# Patient Record
Sex: Male | Born: 1995 | Race: Black or African American | Hispanic: No | Marital: Single | Smoking: Former smoker
Health system: Southern US, Community
[De-identification: ages and names within clinical notes are randomized; demographics above are authoritative.]

## PROBLEM LIST (undated history)

## (undated) DIAGNOSIS — Z8489 Family history of other specified conditions: Secondary | ICD-10-CM

---
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
## 2021-02-16 ENCOUNTER — Inpatient Hospital Stay (HOSPITAL_COMMUNITY): Payer: Commercial Managed Care - PPO | Admitting: Anesthesiology

## 2021-02-16 ENCOUNTER — Inpatient Hospital Stay (HOSPITAL_COMMUNITY): Payer: Commercial Managed Care - PPO

## 2021-02-16 ENCOUNTER — Other Ambulatory Visit: Payer: Self-pay

## 2021-02-16 ENCOUNTER — Encounter (HOSPITAL_COMMUNITY): Admission: EM | Disposition: A | Discharge status: Home / Self Care | Payer: Self-pay | Source: Home / Self Care

## 2021-02-16 ENCOUNTER — Emergency Department (HOSPITAL_COMMUNITY): Payer: Commercial Managed Care - PPO

## 2021-02-16 ENCOUNTER — Encounter (HOSPITAL_COMMUNITY): Payer: Self-pay

## 2021-02-16 ENCOUNTER — Inpatient Hospital Stay (HOSPITAL_COMMUNITY)
Admission: EM | Admit: 2021-02-16 | Discharge: 2021-02-19 | DRG: 956 | Disposition: A | Discharge status: Home / Self Care | Payer: Commercial Managed Care - PPO | Attending: General Surgery | Admitting: General Surgery

## 2021-02-16 DIAGNOSIS — S0083XA Contusion of other part of head, initial encounter: Secondary | ICD-10-CM | POA: Diagnosis present

## 2021-02-16 DIAGNOSIS — S36113A Laceration of liver, unspecified degree, initial encounter: Secondary | ICD-10-CM | POA: Diagnosis not present

## 2021-02-16 DIAGNOSIS — S36115A Moderate laceration of liver, initial encounter: Secondary | ICD-10-CM | POA: Diagnosis present

## 2021-02-16 DIAGNOSIS — Y92410 Unspecified street and highway as the place of occurrence of the external cause: Secondary | ICD-10-CM | POA: Diagnosis not present

## 2021-02-16 DIAGNOSIS — Z20822 Contact with and (suspected) exposure to covid-19: Secondary | ICD-10-CM | POA: Diagnosis present

## 2021-02-16 DIAGNOSIS — Z419 Encounter for procedure for purposes other than remedying health state, unspecified: Secondary | ICD-10-CM

## 2021-02-16 DIAGNOSIS — S72001A Fracture of unspecified part of neck of right femur, initial encounter for closed fracture: Secondary | ICD-10-CM | POA: Diagnosis not present

## 2021-02-16 DIAGNOSIS — D62 Acute posthemorrhagic anemia: Secondary | ICD-10-CM | POA: Diagnosis present

## 2021-02-16 DIAGNOSIS — S72331A Displaced oblique fracture of shaft of right femur, initial encounter for closed fracture: Secondary | ICD-10-CM

## 2021-02-16 DIAGNOSIS — Z23 Encounter for immunization: Secondary | ICD-10-CM | POA: Diagnosis not present

## 2021-02-16 DIAGNOSIS — E860 Dehydration: Secondary | ICD-10-CM | POA: Diagnosis present

## 2021-02-16 DIAGNOSIS — S2243XA Multiple fractures of ribs, bilateral, initial encounter for closed fracture: Secondary | ICD-10-CM | POA: Diagnosis present

## 2021-02-16 DIAGNOSIS — M549 Dorsalgia, unspecified: Secondary | ICD-10-CM | POA: Diagnosis present

## 2021-02-16 DIAGNOSIS — R000 Tachycardia, unspecified: Secondary | ICD-10-CM | POA: Diagnosis present

## 2021-02-16 DIAGNOSIS — N179 Acute kidney failure, unspecified: Secondary | ICD-10-CM | POA: Diagnosis present

## 2021-02-16 DIAGNOSIS — Z9889 Other specified postprocedural states: Secondary | ICD-10-CM

## 2021-02-16 DIAGNOSIS — S7291XA Unspecified fracture of right femur, initial encounter for closed fracture: Secondary | ICD-10-CM | POA: Diagnosis not present

## 2021-02-16 DIAGNOSIS — Z87891 Personal history of nicotine dependence: Secondary | ICD-10-CM | POA: Diagnosis not present

## 2021-02-16 DIAGNOSIS — T1490XA Injury, unspecified, initial encounter: Secondary | ICD-10-CM

## 2021-02-16 DIAGNOSIS — S27321A Contusion of lung, unilateral, initial encounter: Secondary | ICD-10-CM | POA: Diagnosis present

## 2021-02-16 DIAGNOSIS — S2243XS Multiple fractures of ribs, bilateral, sequela: Secondary | ICD-10-CM

## 2021-02-16 HISTORY — DX: Family history of other specified conditions: Z84.89

## 2021-02-16 HISTORY — PX: FEMUR IM NAIL: SHX1597

## 2021-02-16 LAB — COMPREHENSIVE METABOLIC PANEL
ALT: 592 U/L — ABNORMAL HIGH (ref 0–44)
AST: 579 U/L — ABNORMAL HIGH (ref 15–41)
Albumin: 4.4 g/dL (ref 3.5–5.0)
Alkaline Phosphatase: 69 U/L (ref 38–126)
Anion gap: 14 (ref 5–15)
BUN: 16 mg/dL (ref 6–20)
CO2: 25 mmol/L (ref 22–32)
Calcium: 9.6 mg/dL (ref 8.9–10.3)
Chloride: 103 mmol/L (ref 98–111)
Creatinine, Ser: 1.44 mg/dL — ABNORMAL HIGH (ref 0.61–1.24)
GFR, Estimated: >60 mL/min (ref >60)
Glucose, Bld: 122 mg/dL — ABNORMAL HIGH (ref 70–99)
Potassium: 3 mmol/L — ABNORMAL LOW (ref 3.5–5.1)
Sodium: 142 mmol/L (ref 135–145)
Total Bilirubin: 0.5 mg/dL (ref 0.3–1.2)
Total Protein: 7.4 g/dL (ref 6.5–8.1)

## 2021-02-16 LAB — TYPE AND SCREEN
ABO/RH(D): O POS
Antibody Screen: NEGATIVE

## 2021-02-16 LAB — ETHANOL: Alcohol, Ethyl (B): <10 mg/dL (ref <10)

## 2021-02-16 LAB — CBC
HCT: 46.3 % (ref 39.0–52.0)
Hemoglobin: 15.4 g/dL (ref 13.0–17.0)
MCH: 29.6 pg (ref 26.0–34.0)
MCHC: 33.3 g/dL (ref 30.0–36.0)
MCV: 89 fL (ref 80.0–100.0)
Platelets: 248 10*3/uL (ref 150–400)
RBC: 5.2 MIL/uL (ref 4.22–5.81)
RDW: 11.8 % (ref 11.5–15.5)
WBC: 8.2 10*3/uL (ref 4.0–10.5)
nRBC: 0 % (ref 0.0–0.2)

## 2021-02-16 LAB — I-STAT CHEM 8, ED
BUN: 18 mg/dL (ref 6–20)
Calcium, Ion: 1.2 mmol/L (ref 1.15–1.40)
Chloride: 102 mmol/L (ref 98–111)
Creatinine, Ser: 1.4 mg/dL — ABNORMAL HIGH (ref 0.61–1.24)
Glucose, Bld: 120 mg/dL — ABNORMAL HIGH (ref 70–99)
HCT: 47 % (ref 39.0–52.0)
Hemoglobin: 16 g/dL (ref 13.0–17.0)
Potassium: 2.9 mmol/L — ABNORMAL LOW (ref 3.5–5.1)
Sodium: 143 mmol/L (ref 135–145)
TCO2: 26 mmol/L (ref 22–32)

## 2021-02-16 LAB — URINALYSIS, ROUTINE W REFLEX MICROSCOPIC
Bilirubin Urine: NEGATIVE
Glucose, UA: NEGATIVE mg/dL
Ketones, ur: NEGATIVE mg/dL
Leukocytes,Ua: NEGATIVE
Nitrite: NEGATIVE
Protein, ur: 100 mg/dL — AB
Specific Gravity, Urine: 1.016 (ref 1.005–1.030)
pH: 6 (ref 5.0–8.0)

## 2021-02-16 LAB — RESP PANEL BY RT-PCR (FLU A&B, COVID) ARPGX2
Influenza A by PCR: NEGATIVE
Influenza B by PCR: NEGATIVE
SARS Coronavirus 2 by RT PCR: NEGATIVE

## 2021-02-16 LAB — PROTIME-INR
INR: 0.9 (ref 0.8–1.2)
Prothrombin Time: 12.4 seconds (ref 11.4–15.2)

## 2021-02-16 LAB — SAMPLE TO BLOOD BANK

## 2021-02-16 LAB — ABO/RH: ABO/RH(D): O POS

## 2021-02-16 LAB — LACTIC ACID, PLASMA: Lactic Acid, Venous: 3.3 mmol/L (ref 0.5–1.9)

## 2021-02-16 LAB — HIV ANTIBODY (ROUTINE TESTING W REFLEX): HIV Screen 4th Generation wRfx: NONREACTIVE

## 2021-02-16 SURGERY — INSERTION, INTRAMEDULLARY ROD, FEMUR
Anesthesia: General | Site: Hip | Laterality: Right

## 2021-02-16 MED ORDER — 0.9 % SODIUM CHLORIDE (POUR BTL) OPTIME
TOPICAL | Status: DC | PRN
  Administered 2021-02-16 (×2): 1000 mL

## 2021-02-16 MED ORDER — PROPOFOL 10 MG/ML IV BOLUS
INTRAVENOUS | Status: AC
  Filled 2021-02-16: qty 20

## 2021-02-16 MED ORDER — MORPHINE SULFATE (PF) 4 MG/ML IV SOLN
INTRAVENOUS | Status: AC
  Filled 2021-02-16: qty 1

## 2021-02-16 MED ORDER — MIDAZOLAM HCL 2 MG/2ML IJ SOLN
INTRAMUSCULAR | Status: AC
  Filled 2021-02-16: qty 2

## 2021-02-16 MED ORDER — CHLORHEXIDINE GLUCONATE 0.12 % MT SOLN
OROMUCOSAL | Status: AC
  Administered 2021-02-16: 15 mL via OROMUCOSAL
  Filled 2021-02-16: qty 15

## 2021-02-16 MED ORDER — OXYCODONE HCL 5 MG PO TABS
5.0000 mg | ORAL_TABLET | ORAL | Status: DC | PRN
  Administered 2021-02-17 – 2021-02-18 (×2): 5 mg via ORAL
  Filled 2021-02-16 (×2): qty 1

## 2021-02-16 MED ORDER — SUGAMMADEX SODIUM 200 MG/2ML IV SOLN
INTRAVENOUS | Status: DC | PRN
Start: 2021-02-16 — End: 2021-02-16
  Administered 2021-02-16: 200 mg via INTRAVENOUS

## 2021-02-16 MED ORDER — GLYCOPYRROLATE 0.2 MG/ML IJ SOLN
INTRAMUSCULAR | Status: DC | PRN
Start: 2021-02-16 — End: 2021-02-16
  Administered 2021-02-16: .2 mg via INTRAVENOUS

## 2021-02-16 MED ORDER — LIDOCAINE HCL (CARDIAC) PF 100 MG/5ML IV SOSY
PREFILLED_SYRINGE | INTRAVENOUS | Status: DC | PRN
  Administered 2021-02-16: 100 mg via INTRAVENOUS

## 2021-02-16 MED ORDER — CEFAZOLIN SODIUM-DEXTROSE 2-3 GM-%(50ML) IV SOLR
INTRAVENOUS | Status: DC | PRN
Start: 2021-02-16 — End: 2021-02-16
  Administered 2021-02-16: 2 g via INTRAVENOUS

## 2021-02-16 MED ORDER — FENTANYL CITRATE (PF) 250 MCG/5ML IJ SOLN
INTRAMUSCULAR | Status: AC
  Filled 2021-02-16: qty 5

## 2021-02-16 MED ORDER — PANTOPRAZOLE SODIUM 40 MG IV SOLR: 40.0000 mg | Freq: Every day | INTRAVENOUS | Status: DC

## 2021-02-16 MED ORDER — MORPHINE SULFATE (PF) 2 MG/ML IV SOLN
INTRAVENOUS | Status: AC | PRN
  Administered 2021-02-16: 4 mg via INTRAVENOUS

## 2021-02-16 MED ORDER — PHENYLEPHRINE 40 MCG/ML (10ML) SYRINGE FOR IV PUSH (FOR BLOOD PRESSURE SUPPORT)
PREFILLED_SYRINGE | INTRAVENOUS | Status: AC
  Filled 2021-02-16: qty 10

## 2021-02-16 MED ORDER — GLYCOPYRROLATE PF 0.2 MG/ML IJ SOSY
PREFILLED_SYRINGE | INTRAMUSCULAR | Status: AC
  Filled 2021-02-16: qty 1

## 2021-02-16 MED ORDER — ROCURONIUM 10MG/ML (10ML) SYRINGE FOR MEDFUSION PUMP - OPTIME
INTRAVENOUS | Status: DC | PRN
Start: 2021-02-16 — End: 2021-02-16
  Administered 2021-02-16: 100 mg via INTRAVENOUS

## 2021-02-16 MED ORDER — MIDAZOLAM HCL 2 MG/2ML IJ SOLN
INTRAMUSCULAR | Status: DC | PRN
Start: 2021-02-16 — End: 2021-02-16
  Administered 2021-02-16: 2 mg via INTRAVENOUS

## 2021-02-16 MED ORDER — PHENYLEPHRINE HCL-NACL 20-0.9 MG/250ML-% IV SOLN
INTRAVENOUS | Status: DC | PRN
  Administered 2021-02-16: 50 ug/min via INTRAVENOUS

## 2021-02-16 MED ORDER — PHENYLEPHRINE HCL (PRESSORS) 10 MG/ML IV SOLN
INTRAVENOUS | Status: DC | PRN
Start: 2021-02-16 — End: 2021-02-16
  Administered 2021-02-16: 200 mg via INTRAVENOUS
  Administered 2021-02-16: 120 mg via INTRAVENOUS
  Administered 2021-02-16: 80 mg via INTRAVENOUS

## 2021-02-16 MED ORDER — PHENYLEPHRINE HCL (PRESSORS) 10 MG/ML IV SOLN
INTRAVENOUS | Status: AC
  Filled 2021-02-16: qty 1

## 2021-02-16 MED ORDER — ONDANSETRON HCL 4 MG/2ML IJ SOLN
INTRAMUSCULAR | Status: AC
  Filled 2021-02-16: qty 2

## 2021-02-16 MED ORDER — DEXMEDETOMIDINE (PRECEDEX) IN NS 20 MCG/5ML (4 MCG/ML) IV SYRINGE
PREFILLED_SYRINGE | INTRAVENOUS | Status: DC | PRN
  Administered 2021-02-16: 12 ug via INTRAVENOUS

## 2021-02-16 MED ORDER — ACETAMINOPHEN 325 MG PO TABS
650.0000 mg | ORAL_TABLET | Freq: Four times a day (QID) | ORAL | Status: DC
  Administered 2021-02-16 – 2021-02-17 (×4): 650 mg via ORAL
  Filled 2021-02-16 (×4): qty 2

## 2021-02-16 MED ORDER — ORAL CARE MOUTH RINSE: 15.0000 mL | Freq: Once | OROMUCOSAL | Status: AC

## 2021-02-16 MED ORDER — PROPOFOL 10 MG/ML IV BOLUS
INTRAVENOUS | Status: DC | PRN
  Administered 2021-02-16: 180 mg via INTRAVENOUS

## 2021-02-16 MED ORDER — LACTATED RINGERS IV SOLN
INTRAVENOUS | Status: DC | PRN
Start: 2021-02-16 — End: 2021-02-16

## 2021-02-16 MED ORDER — CHLORHEXIDINE GLUCONATE 0.12 % MT SOLN: 15.0000 mL | Freq: Once | OROMUCOSAL | Status: AC

## 2021-02-16 MED ORDER — PANTOPRAZOLE SODIUM 40 MG PO TBEC
40.0000 mg | DELAYED_RELEASE_TABLET | Freq: Every day | ORAL | Status: DC
  Administered 2021-02-17 – 2021-02-18 (×2): 40 mg via ORAL
  Filled 2021-02-16 (×2): qty 1

## 2021-02-16 MED ORDER — ONDANSETRON 4 MG PO TBDP: 4.0000 mg | ORAL_TABLET | Freq: Four times a day (QID) | ORAL | Status: DC | PRN

## 2021-02-16 MED ORDER — TETANUS-DIPHTH-ACELL PERTUSSIS 5-2.5-18.5 LF-MCG/0.5 IM SUSY
0.5000 mL | PREFILLED_SYRINGE | Freq: Once | INTRAMUSCULAR | Status: AC
  Administered 2021-02-16: 0.5 mL via INTRAMUSCULAR
  Filled 2021-02-16: qty 0.5

## 2021-02-16 MED ORDER — IOHEXOL 300 MG/ML  SOLN
100.0000 mL | Freq: Once | INTRAMUSCULAR | Status: AC | PRN
  Administered 2021-02-16: 100 mL via INTRAVENOUS

## 2021-02-16 MED ORDER — FENTANYL CITRATE (PF) 250 MCG/5ML IJ SOLN
INTRAMUSCULAR | Status: DC | PRN
  Administered 2021-02-16: 50 ug via INTRAVENOUS
  Administered 2021-02-16 (×2): 100 ug via INTRAVENOUS

## 2021-02-16 MED ORDER — OXYCODONE HCL 5 MG PO TABS
10.0000 mg | ORAL_TABLET | ORAL | Status: DC | PRN
  Administered 2021-02-18 – 2021-02-19 (×2): 10 mg via ORAL
  Filled 2021-02-16 (×3): qty 2

## 2021-02-16 MED ORDER — DEXAMETHASONE SODIUM PHOSPHATE 10 MG/ML IJ SOLN
INTRAMUSCULAR | Status: AC
  Filled 2021-02-16: qty 1

## 2021-02-16 MED ORDER — ONDANSETRON HCL 4 MG/2ML IJ SOLN
INTRAMUSCULAR | Status: DC | PRN
Start: 2021-02-16 — End: 2021-02-16
  Administered 2021-02-16: 4 mg via INTRAVENOUS

## 2021-02-16 MED ORDER — INFLUENZA VAC SPLIT QUAD 0.5 ML IM SUSY
0.5000 mL | PREFILLED_SYRINGE | INTRAMUSCULAR | Status: DC
Start: 2021-02-17 — End: 2021-02-19
  Filled 2021-02-16: qty 0.5

## 2021-02-16 MED ORDER — PHENOL 1.4 % MT LIQD: 1.0000 | OROMUCOSAL | Status: DC | PRN

## 2021-02-16 MED ORDER — ROCURONIUM BROMIDE 10 MG/ML (PF) SYRINGE
PREFILLED_SYRINGE | INTRAVENOUS | Status: AC
  Filled 2021-02-16: qty 10

## 2021-02-16 MED ORDER — METHOCARBAMOL 1000 MG/10ML IJ SOLN
1000.0000 mg | Freq: Three times a day (TID) | INTRAVENOUS | Status: DC
  Administered 2021-02-16 – 2021-02-18 (×5): 1000 mg via INTRAVENOUS
  Filled 2021-02-16: qty 10
  Filled 2021-02-16: qty 1000
  Filled 2021-02-16 (×2): qty 10
  Filled 2021-02-16: qty 1000
  Filled 2021-02-16 (×4): qty 10

## 2021-02-16 MED ORDER — CEFAZOLIN SODIUM-DEXTROSE 2-4 GM/100ML-% IV SOLN
INTRAVENOUS | Status: AC
  Filled 2021-02-16: qty 100

## 2021-02-16 MED ORDER — DEXAMETHASONE SODIUM PHOSPHATE 10 MG/ML IJ SOLN
INTRAMUSCULAR | Status: DC | PRN
  Administered 2021-02-16: 10 mg via INTRAVENOUS

## 2021-02-16 MED ORDER — HYDROMORPHONE HCL 1 MG/ML IJ SOLN
1.0000 mg | INTRAMUSCULAR | Status: DC | PRN
  Administered 2021-02-16: 1 mg via INTRAVENOUS
  Filled 2021-02-16: qty 1

## 2021-02-16 MED ORDER — LACTATED RINGERS IV SOLN: INTRAVENOUS | Status: DC

## 2021-02-16 MED ORDER — ONDANSETRON HCL 4 MG/2ML IJ SOLN: 4.0000 mg | Freq: Four times a day (QID) | INTRAMUSCULAR | Status: DC | PRN

## 2021-02-16 SURGICAL SUPPLY — 50 items
BAG COUNTER SPONGE SURGICOUNT (BAG) ×2 IMPLANT
BIT DRILL LAG SCREW META-TAN (DRILL) IMPLANT
BIT DRILL SHORT 4.0 (BIT) IMPLANT
BNDG COHESIVE 4X5 TAN STRL (GAUZE/BANDAGES/DRESSINGS) ×2 IMPLANT
COVER PERINEAL POST (MISCELLANEOUS) ×2 IMPLANT
COVER SURGICAL LIGHT HANDLE (MISCELLANEOUS) ×2 IMPLANT
DRAPE C-ARMOR (DRAPES) ×2 IMPLANT
DRAPE HALF SHEET 40X57 (DRAPES) ×2 IMPLANT
DRAPE INCISE IOBAN 66X45 STRL (DRAPES) ×1 IMPLANT
DRAPE ORTHO SPLIT 77X108 STRL (DRAPES) ×1
DRAPE STERI IOBAN 125X83 (DRAPES) ×1 IMPLANT
DRAPE SURG ORHT 6 SPLT 77X108 (DRAPES) IMPLANT
DRESSING MEPILEX FLEX 4X4 (GAUZE/BANDAGES/DRESSINGS) IMPLANT
DRILL BIT SHORT 4.0 (BIT) ×2
DRILL LAG SCREW META-TAN (DRILL) ×2
DRSG MEPILEX BORDER 4X4 (GAUZE/BANDAGES/DRESSINGS) ×6 IMPLANT
DRSG MEPILEX FLEX 4X4 (GAUZE/BANDAGES/DRESSINGS) ×2
DURAPREP 26ML APPLICATOR (WOUND CARE) ×2 IMPLANT
ELECT REM PT RETURN 9FT ADLT (ELECTROSURGICAL) ×2
ELECTRODE REM PT RTRN 9FT ADLT (ELECTROSURGICAL) IMPLANT
FACESHIELD WRAPAROUND (MASK) ×4 IMPLANT
FACESHIELD WRAPAROUND OR TEAM (MASK) ×2 IMPLANT
GLOVE BIOGEL PI IND STRL 7.5 (GLOVE) ×1 IMPLANT
GLOVE BIOGEL PI INDICATOR 7.5 (GLOVE) ×1
GLOVE SURG LTX SZ7 (GLOVE) ×2 IMPLANT
GLOVE SURG UNDER POLY LF SZ7 (GLOVE) ×23 IMPLANT
GLOVE SURG UNDER POLY LF SZ7.5 (GLOVE) ×4 IMPLANT
GOWN STRL REIN XL XLG (GOWN DISPOSABLE) ×2 IMPLANT
GUIDE PIN 3.2X343 (PIN) ×2
GUIDE PIN 3.2X343MM (PIN) ×2
GUIDE ROD 3.0 (MISCELLANEOUS) ×2
KIT BASIN OR (CUSTOM PROCEDURE TRAY) ×2 IMPLANT
KIT TURNOVER KIT B (KITS) ×2 IMPLANT
MANIFOLD NEPTUNE II (INSTRUMENTS) ×1 IMPLANT
NAIL LOCK COMPR 10X42 RT (Nail) ×1 IMPLANT
NS IRRIG 1000ML POUR BTL (IV SOLUTION) ×2 IMPLANT
PACK GENERAL/GYN (CUSTOM PROCEDURE TRAY) ×2 IMPLANT
PAD ARMBOARD 7.5X6 YLW CONV (MISCELLANEOUS) ×4 IMPLANT
PIN GUIDE 3.2X343MM (PIN) IMPLANT
ROD GUIDE 3.0 (MISCELLANEOUS) IMPLANT
SCREW COMP LAG 90X85 (Screw) ×1 IMPLANT
SCREW SET META-TAN 0MM (Screw) ×1 IMPLANT
SCREW TRIGEN LOW PROF 5.0X42.5 (Screw) ×1 IMPLANT
SCREW TRIGEN LOW PROF 5.0X50 (Screw) ×1 IMPLANT
STAPLER VISISTAT 35W (STAPLE) ×2 IMPLANT
SUT VIC AB 0 CT1 27 (SUTURE) ×1
SUT VIC AB 0 CT1 27XBRD ANBCTR (SUTURE) ×1 IMPLANT
SUT VIC AB 2-0 CT1 27 (SUTURE) ×2
SUT VIC AB 2-0 CT1 TAPERPNT 27 (SUTURE) ×1 IMPLANT
WATER STERILE IRR 1000ML POUR (IV SOLUTION) ×4 IMPLANT
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
## 2021-02-16 NOTE — ED Notes (Signed)
Provider at bedside at this time. Mother also at bedside

## 2021-02-16 NOTE — Anesthesia Procedure Notes (Addendum)
Procedure Name: Intubation Date/Time: 02/16/2021 10:20 AM Performed by: Claris Che, CRNA Pre-anesthesia Checklist: Patient identified, Emergency Drugs available, Suction available, Patient being monitored and Timeout performed Patient Re-evaluated:Patient Re-evaluated prior to induction Oxygen Delivery Method: Circle system utilized Preoxygenation: Pre-oxygenation with 100% oxygen Induction Type: IV induction and Cricoid Pressure applied Ventilation: Mask ventilation without difficulty Laryngoscope Size: Mac and 4 Grade View: Grade I Tube type: Oral Tube size: 7.5 mm Number of attempts: 1 Airway Equipment and Method: Stylet Placement Confirmation: ETT inserted through vocal cords under direct vision, positive ETCO2 and breath sounds checked- equal and bilateral Secured at: 23 cm Tube secured with: Tape Dental Injury: Teeth and Oropharynx as per pre-operative assessment
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
## 2021-02-16 NOTE — ED Notes (Signed)
Pt remains in ct at this time

## 2021-02-16 NOTE — Anesthesia Procedure Notes (Deleted)
Procedure Name: Intubation Date/Time: 02/16/2021 10:20 AM Performed by: Claris Che, CRNA Pre-anesthesia Checklist: Patient identified, Emergency Drugs available, Suction available, Patient being monitored and Timeout performed Patient Re-evaluated:Patient Re-evaluated prior to induction Oxygen Delivery Method: Circle system utilized Preoxygenation: Pre-oxygenation with 100% oxygen Induction Type: Cricoid Pressure applied Ventilation: Mask ventilation without difficulty Laryngoscope Size: Mac and 4 Grade View: Grade I Tube size: 7.5 mm Number of attempts: 1 Airway Equipment and Method: Stylet Placement Confirmation: ETT inserted through vocal cords under direct vision, positive ETCO2 and breath sounds checked- equal and bilateral Secured at: 24 cm Tube secured with: Tape Dental Injury: Teeth and Oropharynx as per pre-operative assessment
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
## 2021-02-16 NOTE — ED Notes (Signed)
Per Dr. Judd Lien c-collar can be removed. Removed c-collar at this time

## 2021-02-16 NOTE — ED Notes (Signed)
Pt states last to eat or drink -02/15/21 @ 2200

## 2021-02-16 NOTE — ED Notes (Signed)
Radiology at bedside at this time.

## 2021-02-16 NOTE — Progress Notes (Signed)
Orthopedic Tech Progress Note Patient Details:  Jared Potter [birth year] [id number]  Patient ID: Jared Potter, male   DOB: April 15, 1995, 26 y.o.   MRN: [id number] Level II; not needed at the moment.  [name] 02/16/2021, 4:40 AM

## 2021-02-16 NOTE — ED Notes (Signed)
Transported to ct 

## 2021-02-16 NOTE — Anesthesia Preprocedure Evaluation (Addendum)
Anesthesia Evaluation  Patient identified by MRN, date of birth, ID band Patient awake    Reviewed: Allergy & Precautions, H&P , NPO status , Patient's Chart, lab work & pertinent test results  Airway Mallampati: II   Neck ROM: full    Dental   Pulmonary former smoker,    breath sounds clear to auscultation       Cardiovascular negative cardio ROS   Rhythm:regular Rate:Normal     Neuro/Psych negative neurological ROS  negative psych ROS   GI/Hepatic negative GI ROS, Liver laceration Elevated LFT's   Endo/Other    Renal/GU negative Renal ROS  negative genitourinary   Musculoskeletal Right Femur Fx- closed Multiple Rib Fx's right posterior 10th and 11th , left posteromedial 3rd-6th ribs   Abdominal   Peds  Hematology negative hematology ROS (+)   Anesthesia Other Findings   Reproductive/Obstetrics                            Anesthesia Physical Anesthesia Plan  ASA: 2 and emergent  Anesthesia Plan: General   Post-op Pain Management: Ofirmev IV (intra-op), Lidocaine infusion and Ketamine IV   Induction: Intravenous  PONV Risk Score and Plan: 3 and Treatment may vary due to age or medical condition, Midazolam, Ondansetron and Dexamethasone  Airway Management Planned: Oral ETT  Additional Equipment:   Intra-op Plan:   Post-operative Plan: Extubation in OR  Informed Consent: I have reviewed the patients History and Physical, chart, labs and discussed the procedure including the risks, benefits and alternatives for the proposed anesthesia with the patient or authorized representative who has indicated his/her understanding and acceptance.     Dental advisory given  Plan Discussed with: CRNA and Anesthesiologist  Anesthesia Plan Comments:         Anesthesia Quick Evaluation
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
## 2021-02-16 NOTE — Transfer of Care (Signed)
Immediate Anesthesia Transfer of Care Note  Patient: Jared Potter  Procedure(s) Performed: INTRAMEDULLARY (IM) NAIL FEMORAL (Right: Hip)  Patient Location: PACU  Anesthesia Type:General  Level of Consciousness: drowsy, patient cooperative and responds to stimulation  Airway & Oxygen Therapy: Patient Spontanous Breathing and Patient connected to nasal cannula oxygen  Post-op Assessment: Report given to RN, Post -op Vital signs reviewed and stable and Patient moving all extremities X 4  Post vital signs: Reviewed and stable  Last Vitals:  Vitals Value Taken Time  BP 131/71 02/16/21 1204  Temp    Pulse 80 02/16/21 1215  Resp 15 02/16/21 1215  SpO2 100 % 02/16/21 1215  Vitals shown include unvalidated device data.  Last Pain:  Vitals:   02/16/21 0857  TempSrc:   PainSc: 8          Complications: No notable events documented.

## 2021-02-16 NOTE — ED Notes (Signed)
Family at bedside. 

## 2021-02-16 NOTE — ED Notes (Signed)
Assisted pt with urinal. Mother and law enforcement at bedside. Pt denies any other needs at this time
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
## 2021-02-17 ENCOUNTER — Inpatient Hospital Stay (HOSPITAL_COMMUNITY): Payer: Commercial Managed Care - PPO

## 2021-02-17 LAB — CBC
HCT: 38.5 % — ABNORMAL LOW (ref 39.0–52.0)
Hemoglobin: 12.5 g/dL — ABNORMAL LOW (ref 13.0–17.0)
MCH: 29.1 pg (ref 26.0–34.0)
MCHC: 32.5 g/dL (ref 30.0–36.0)
MCV: 89.7 fL (ref 80.0–100.0)
Platelets: 246 10*3/uL (ref 150–400)
RBC: 4.29 MIL/uL (ref 4.22–5.81)
RDW: 11.9 % (ref 11.5–15.5)
WBC: 10.2 10*3/uL (ref 4.0–10.5)
nRBC: 0 % (ref 0.0–0.2)

## 2021-02-17 LAB — BASIC METABOLIC PANEL
Anion gap: 10 (ref 5–15)
BUN: 11 mg/dL (ref 6–20)
CO2: 27 mmol/L (ref 22–32)
Calcium: 9.5 mg/dL (ref 8.9–10.3)
Chloride: 103 mmol/L (ref 98–111)
Creatinine, Ser: 1.31 mg/dL — ABNORMAL HIGH (ref 0.61–1.24)
GFR, Estimated: >60 mL/min (ref >60)
Glucose, Bld: 134 mg/dL — ABNORMAL HIGH (ref 70–99)
Potassium: 3.6 mmol/L (ref 3.5–5.1)
Sodium: 140 mmol/L (ref 135–145)

## 2021-02-17 MED ORDER — DOCUSATE SODIUM 100 MG PO CAPS
100.0000 mg | ORAL_CAPSULE | Freq: Two times a day (BID) | ORAL | Status: DC
  Administered 2021-02-17 – 2021-02-18 (×4): 100 mg via ORAL
  Filled 2021-02-17 (×4): qty 1

## 2021-02-17 MED ORDER — GABAPENTIN 300 MG PO CAPS
300.0000 mg | ORAL_CAPSULE | Freq: Three times a day (TID) | ORAL | Status: DC
  Administered 2021-02-17 – 2021-02-18 (×6): 300 mg via ORAL
  Filled 2021-02-17 (×6): qty 1

## 2021-02-17 MED ORDER — ACETAMINOPHEN 500 MG PO TABS
1000.0000 mg | ORAL_TABLET | Freq: Four times a day (QID) | ORAL | Status: DC
  Administered 2021-02-17 – 2021-02-19 (×7): 1000 mg via ORAL
  Filled 2021-02-17 (×10): qty 2

## 2021-02-17 NOTE — Progress Notes (Signed)
Patient stable, no events Reports soreness all over Did well with PT this morning NVI RLE, dressings c/d/I Stable for d/c from ortho stand point Follow up in office in 2 weeks  N. Glee Arvin, MD [facility] 9:22 AM
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
## 2021-02-18 ENCOUNTER — Encounter (HOSPITAL_COMMUNITY): Payer: Self-pay | Admitting: Orthopaedic Surgery

## 2021-02-18 LAB — BASIC METABOLIC PANEL
Anion gap: 7 (ref 5–15)
BUN: 13 mg/dL (ref 6–20)
CO2: 29 mmol/L (ref 22–32)
Calcium: 8.9 mg/dL (ref 8.9–10.3)
Chloride: 104 mmol/L (ref 98–111)
Creatinine, Ser: 1.29 mg/dL — ABNORMAL HIGH (ref 0.61–1.24)
GFR, Estimated: >60 mL/min (ref >60)
Glucose, Bld: 104 mg/dL — ABNORMAL HIGH (ref 70–99)
Potassium: 4 mmol/L (ref 3.5–5.1)
Sodium: 140 mmol/L (ref 135–145)

## 2021-02-18 LAB — CBC
HCT: 32.8 % — ABNORMAL LOW (ref 39.0–52.0)
Hemoglobin: 11 g/dL — ABNORMAL LOW (ref 13.0–17.0)
MCH: 30 pg (ref 26.0–34.0)
MCHC: 33.5 g/dL (ref 30.0–36.0)
MCV: 89.4 fL (ref 80.0–100.0)
Platelets: 190 10*3/uL (ref 150–400)
RBC: 3.67 MIL/uL — ABNORMAL LOW (ref 4.22–5.81)
RDW: 11.8 % (ref 11.5–15.5)
WBC: 7.2 10*3/uL (ref 4.0–10.5)
nRBC: 0 % (ref 0.0–0.2)

## 2021-02-18 MED ORDER — POLYETHYLENE GLYCOL 3350 17 G PO PACK
17.0000 g | PACK | Freq: Every day | ORAL | Status: DC
  Filled 2021-02-18: qty 1

## 2021-02-18 MED ORDER — ENOXAPARIN SODIUM 30 MG/0.3ML IJ SOSY: 30.0000 mg | PREFILLED_SYRINGE | Freq: Two times a day (BID) | INTRAMUSCULAR | Status: DC

## 2021-02-18 MED ORDER — OXYCODONE-ACETAMINOPHEN 5-325 MG PO TABS
1.0000 | ORAL_TABLET | Freq: Four times a day (QID) | ORAL | 0 refills | Status: DC | PRN
Start: 2021-02-18 — End: 2021-02-19

## 2021-02-18 MED ORDER — ASPIRIN EC 81 MG PO TBEC: 81.0000 mg | DELAYED_RELEASE_TABLET | Freq: Two times a day (BID) | ORAL | 2 refills | Status: DC

## 2021-02-18 MED ORDER — LACTATED RINGERS IV BOLUS
1000.0000 mL | Freq: Once | INTRAVENOUS | Status: AC
  Administered 2021-02-18: 1000 mL via INTRAVENOUS

## 2021-02-18 MED ORDER — METHOCARBAMOL 500 MG PO TABS
1000.0000 mg | ORAL_TABLET | Freq: Three times a day (TID) | ORAL | Status: DC
  Administered 2021-02-18 – 2021-02-19 (×3): 1000 mg via ORAL
  Filled 2021-02-18 (×3): qty 2

## 2021-02-18 NOTE — Progress Notes (Signed)
Doctor said he might go home tomorrow if everything is good.
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
## 2021-02-18 NOTE — Progress Notes (Signed)
Subjective: 2 Days Post-Op Procedure(s) (LRB): INTRAMEDULLARY (IM) NAIL FEMORAL (Right) Patient reports pain as mild.    Objective: Vital signs in last 24 hours: Temp:  [97.7 F (36.5 C)-99.4 F (37.4 C)] 98.2 F (36.8 C) (02/13 0602) Pulse Rate:  [98-108] 108 (02/13 0006) Resp:  [15-22] 21 (02/13 0602) BP: (117-133)/(67-81) 118/77 (02/13 0602) SpO2:  [97 %-100 %] 98 % (02/13 0602)  Intake/Output from previous day: 02/12 0701 - 02/13 0700 In: 920 [P.O.:720; IV Piggyback:200] Out: 1825 [Urine:1825] Intake/Output this shift: No intake/output data recorded.  Recent Labs    02/16/21 0428 02/16/21 0429 02/17/21 0344 02/18/21 0435  HGB 15.4 16.0 12.5* 11.0*   Recent Labs    02/17/21 0344 02/18/21 0435  WBC 10.2 7.2  RBC 4.29 3.67*  HCT 38.5* 32.8*  PLT 246 190   Recent Labs    02/17/21 0344 02/18/21 0435  NA 140 140  K 3.6 4.0  CL 103 104  CO2 27 29  BUN 11 13  CREATININE 1.31* 1.29*  GLUCOSE 134* 104*  CALCIUM 9.5 8.9   Recent Labs    02/16/21 0428  INR 0.9    Neurologically intact Neurovascular intact Sensation intact distally Intact pulses distally Dorsiflexion/Plantar flexion intact Incision: dressing C/D/I No cellulitis present Compartment soft    Assessment/Plan: 2 Days Post-Op Procedure(s) (LRB): INTRAMEDULLARY (IM) NAIL FEMORAL (Right) Up with therapy WBAT RLE ASA 81mg  bid x 4 weeks for dvt ppx F/u with Dr. in two weeks      Roda Shutters 02/18/2021, 9:15 AM
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
## 2021-02-18 NOTE — Progress Notes (Signed)
Mobility Specialist: Progress Note   02/18/21 1459  Mobility  Bed Position Chair  Activity Ambulated with assistance in hallway  Level of Assistance Standby assist, set-up cues, supervision of patient - no hands on  Assistive Device Front wheel [place]  RLE Weight Bearing WBAT  Distance Ambulated (ft) 180 ft  Activity Response Tolerated well  $Mobility charge 1 Mobility   During Mobility: 160 HR  Pt independent with bed mobility and mod I to stand. C/o stiffness on RLE during ambulation but said he feels much better compared to previous session with PT. Pt to BR after session with NT present in the room.  [facility] Suetta Hoffmeister Mobility Specialist Mobility Specialist 5 North: [phone] Mobility Specialist 6 North: [phone]
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
## 2021-02-18 NOTE — Discharge Instructions (Signed)
° ° °  Elevate foot above level of the heart Take aspirin to prevent blood clots Take pain meds as needed weight bearing as tolerated to operative extremity

## 2021-02-19 LAB — BASIC METABOLIC PANEL
Anion gap: 11 (ref 5–15)
BUN: 13 mg/dL (ref 6–20)
CO2: 26 mmol/L (ref 22–32)
Calcium: 9.1 mg/dL (ref 8.9–10.3)
Chloride: 104 mmol/L (ref 98–111)
Creatinine, Ser: 1.27 mg/dL — ABNORMAL HIGH (ref 0.61–1.24)
GFR, Estimated: >60 mL/min (ref >60)
Glucose, Bld: 109 mg/dL — ABNORMAL HIGH (ref 70–99)
Potassium: 3.8 mmol/L (ref 3.5–5.1)
Sodium: 141 mmol/L (ref 135–145)

## 2021-02-19 LAB — CBC
HCT: 32.4 % — ABNORMAL LOW (ref 39.0–52.0)
Hemoglobin: 10.8 g/dL — ABNORMAL LOW (ref 13.0–17.0)
MCH: 30.1 pg (ref 26.0–34.0)
MCHC: 33.3 g/dL (ref 30.0–36.0)
MCV: 90.3 fL (ref 80.0–100.0)
Platelets: 239 10*3/uL (ref 150–400)
RBC: 3.59 MIL/uL — ABNORMAL LOW (ref 4.22–5.81)
RDW: 11.7 % (ref 11.5–15.5)
WBC: 8.3 10*3/uL (ref 4.0–10.5)
nRBC: 0 % (ref 0.0–0.2)

## 2021-02-19 MED ORDER — GABAPENTIN 300 MG PO CAPS: 300.0000 mg | ORAL_CAPSULE | Freq: Three times a day (TID) | ORAL | 0 refills | Status: AC

## 2021-02-19 MED ORDER — POLYETHYLENE GLYCOL 3350 17 G PO PACK: 17.0000 g | PACK | Freq: Every day | ORAL | 0 refills | Status: AC | PRN

## 2021-02-19 MED ORDER — METHOCARBAMOL 1000 MG PO TABS: 1000.0000 mg | ORAL_TABLET | Freq: Three times a day (TID) | ORAL | 0 refills | Status: AC | PRN

## 2021-02-19 MED ORDER — ASPIRIN EC 81 MG PO TBEC: 81.0000 mg | DELAYED_RELEASE_TABLET | Freq: Two times a day (BID) | ORAL | 0 refills | Status: AC

## 2021-02-19 MED ORDER — OXYCODONE HCL 5 MG PO TABS: 5.0000 mg | ORAL_TABLET | Freq: Four times a day (QID) | ORAL | 0 refills | Status: AC | PRN

## 2021-02-19 MED ORDER — DOCUSATE SODIUM 100 MG PO CAPS: 100.0000 mg | ORAL_CAPSULE | Freq: Two times a day (BID) | ORAL | 0 refills | Status: AC

## 2021-02-19 MED ORDER — ACETAMINOPHEN 500 MG PO TABS: 1000.0000 mg | ORAL_TABLET | Freq: Four times a day (QID) | ORAL | 0 refills | Status: AC | PRN

## 2021-02-19 NOTE — Progress Notes (Signed)
Pt discharged to go home. Pt's mother at bedside. Belongings packed, PIV removed, and discharge information educated on. Pt given discharge summary and school note as well. Pt taken to discharge lounge by NS.   Justice Rocher, RN

## 2021-02-19 NOTE — Anesthesia Postprocedure Evaluation (Signed)
Anesthesia Post Note  Patient: Jared Potter  Procedure(s) Performed: INTRAMEDULLARY (IM) NAIL FEMORAL (Right: Hip)     Patient location during evaluation: PACU Anesthesia Type: General Level of consciousness: awake and alert Pain management: pain level controlled Vital Signs Assessment: post-procedure vital signs reviewed and stable Respiratory status: spontaneous breathing, nonlabored ventilation, respiratory function stable and patient connected to nasal cannula oxygen Cardiovascular status: blood pressure returned to baseline and stable Postop Assessment: no apparent nausea or vomiting Anesthetic complications: no   No notable events documented.  Last Vitals:  Vitals:   02/18/21 2332 02/19/21 0338  BP: 117/71 120/79  Pulse: 91 94  Resp: 20 18  Temp: 37 C 37.7 C  SpO2: 98% 99%    Last Pain:  Vitals:   02/19/21 0710  TempSrc:   PainSc: Asleep                 Jovanne Riggenbach S
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
## 2021-03-07 ENCOUNTER — Ambulatory Visit (INDEPENDENT_AMBULATORY_CARE_PROVIDER_SITE_OTHER): Payer: Commercial Managed Care - PPO

## 2021-03-07 ENCOUNTER — Encounter: Payer: Self-pay | Admitting: Physician Assistant

## 2021-03-07 ENCOUNTER — Ambulatory Visit (INDEPENDENT_AMBULATORY_CARE_PROVIDER_SITE_OTHER): Payer: Commercial Managed Care - PPO | Admitting: Orthopaedic Surgery

## 2021-03-07 ENCOUNTER — Other Ambulatory Visit: Payer: Self-pay

## 2021-03-07 DIAGNOSIS — S72331A Displaced oblique fracture of shaft of right femur, initial encounter for closed fracture: Secondary | ICD-10-CM

## 2021-03-07 NOTE — Progress Notes (Signed)
? ?  Post-Op Visit Note ?  ?Patient: Jared Potter           ?Date of Birth: 1995/07/19           ?MRN: [id number] ?Visit Date: 03/07/2021 ?PCP: Pcp, No ? ? ?Assessment & Plan: ? ?Chief Complaint:  ?Chief Complaint  ?Patient presents with  ? Right Leg - Follow-up, Routine Post Op  ?  Right femoral IM nailing 02/16/2021  ? ?Visit Diagnoses:  ?1. Displaced oblique fracture of shaft of right femur, initial encounter for closed fracture (HCC)   ? ? ?Plan: Patient is a pleasant 27 year old gentleman who comes in today approximately 3 weeks status post right IM nail from a femur fracture, date of surgery 02/16/2021.  He has been doing well.  He has been getting physical therapy working on hip and knee range of motion.  He is ambulating with a Kirshenbaum.  Examination of his right hip reveals fully healed surgical incisions with staples intact.  No evidence of infection or cellulitis.  Calf soft nontender.  At this point, he will continue to weight-bear as tolerated.  Continue with therapy.  Continue with DVT prophylaxis for another 4 weeks.  Follow-up with [place] in 4 weeks time for repeat evaluation and right femur x-rays.  Call with concerns or questions. ? ?Follow-Up Instructions: Return in about 3 weeks (around 03/28/2021).  ? ?Orders:  ?Orders Placed This Encounter  ?Procedures  ? XR FEMUR, MIN 2 VIEWS RIGHT  ? ?No orders of the defined types were placed in this encounter. ? ? ?Imaging: ?XR FEMUR, MIN 2 VIEWS RIGHT ? ?Result Date: 03/07/2021 ?X-rays demonstrate stable alignment of the fracture without hardware complication  ? ?PMFS History: ?Patient Active Problem List  ? Diagnosis Date Noted  ? Liver laceration, closed, initial encounter 02/16/2021  ? Displaced oblique fracture of shaft of right femur, initial encounter for closed fracture (HCC) 02/16/2021  ? ?Past Medical History:  ?Diagnosis Date  ? Family history of adverse reaction to anesthesia   ?  ?No family history on file.  ?Past Surgical History:  ?Procedure Laterality  Date  ? FEMUR IM NAIL Right 02/16/2021  ? Procedure: INTRAMEDULLARY (IM) NAIL FEMORAL;  Surgeon: Tarry Kos, MD;  Location: [facility] OR;  Service: Orthopedics;  Laterality: Right;  ? RECONSTRUCTION / REALIGNMENT PATELLA TENDON Right 2012  ? ?Social History  ? ?Occupational History  ? Not on file  ?Tobacco Use  ? Smoking status: Former  ?  Types: Cigarettes  ? Smokeless tobacco: Never  ?Vaping Use  ? Vaping Use: Never used  ?Substance and Sexual Activity  ? Alcohol use: Yes  ?  Alcohol/week: 2.0 standard drinks  ?  Types: 1 Cans of beer, 1 Shots of liquor per week  ? Drug use: Not Currently  ?  Types: Marijuana  ? Sexual activity: Yes  ? ? ? ?

## 2021-03-08 ENCOUNTER — Inpatient Hospital Stay: Payer: Self-pay | Admitting: Physician Assistant

## 2021-03-14 ENCOUNTER — Encounter (HOSPITAL_COMMUNITY): Payer: Self-pay | Admitting: Radiology

## 2021-03-28 ENCOUNTER — Ambulatory Visit (INDEPENDENT_AMBULATORY_CARE_PROVIDER_SITE_OTHER): Payer: Commercial Managed Care - PPO | Admitting: Orthopaedic Surgery

## 2021-03-28 ENCOUNTER — Other Ambulatory Visit: Payer: Self-pay

## 2021-03-28 ENCOUNTER — Ambulatory Visit (INDEPENDENT_AMBULATORY_CARE_PROVIDER_SITE_OTHER): Payer: Commercial Managed Care - PPO

## 2021-03-28 DIAGNOSIS — S72331A Displaced oblique fracture of shaft of right femur, initial encounter for closed fracture: Secondary | ICD-10-CM

## 2021-03-28 NOTE — Progress Notes (Signed)
? ?  Post-Op Visit Note ?  ?Patient: Jared Potter           ?Date of Birth: 1995-10-24           ?MRN: [id number] ?Visit Date: 03/28/2021 ?PCP: Pcp, No ? ? ?Assessment & Plan: ? ?Chief Complaint:  ?Chief Complaint  ?Patient presents with  ? Right Hip - Pain  ? ?Visit Diagnoses:  ?1. Displaced oblique fracture of shaft of right femur, initial encounter for closed fracture (HCC)   ? ? ?Plan: Patient is a pleasant 26 year old who comes in today approximately 6 weeks status post right femur IM nail, date of surgery 02/16/2021.  He has been doing relatively well.  He notes slight soreness to the lateral thigh.  He is no longer taking oxycodone.  He has been in physical therapy making great progress.  He is ambulating unassisted.  Examination of his right leg reveals slight discomfort with hip flexion and full knee extension.  He is neurovascular intact distally.  At this point, he will continue with physical therapy.  Follow-up with [place] in 6 weeks time for repeat evaluation and x-rays of the right femur.  Anticipate possibly ordering a bone stimulator at that point in time. ? ?Follow-Up Instructions: Return in about 6 weeks (around 05/09/2021).  ? ?Orders:  ?Orders Placed This Encounter  ?Procedures  ? XR FEMUR, MIN 2 VIEWS RIGHT  ? Ambulatory referral to Family Practice  ? ?No orders of the defined types were placed in this encounter. ? ? ?Imaging: ?XR FEMUR, MIN 2 VIEWS RIGHT ? ?Result Date: 03/28/2021 ?X-rays demonstrate evidence of callus formation on the lateral view.  There is no evidence of hardware complication.  ? ?PMFS History: ?Patient Active Problem List  ? Diagnosis Date Noted  ? Liver laceration, closed, initial encounter 02/16/2021  ? Displaced oblique fracture of shaft of right femur, initial encounter for closed fracture (HCC) 02/16/2021  ? ?Past Medical History:  ?Diagnosis Date  ? Family history of adverse reaction to anesthesia   ?  ?No family history on file.  ?Past Surgical History:  ?Procedure Laterality  Date  ? FEMUR IM NAIL Right 02/16/2021  ? Procedure: INTRAMEDULLARY (IM) NAIL FEMORAL;  Surgeon: Tarry Kos, MD;  Location: [facility] OR;  Service: Orthopedics;  Laterality: Right;  ? RECONSTRUCTION / REALIGNMENT PATELLA TENDON Right 2012  ? ?Social History  ? ?Occupational History  ? Not on file  ?Tobacco Use  ? Smoking status: Former  ?  Types: Cigarettes  ? Smokeless tobacco: Never  ?Vaping Use  ? Vaping Use: Never used  ?Substance and Sexual Activity  ? Alcohol use: Yes  ?  Alcohol/week: 2.0 standard drinks  ?  Types: 1 Cans of beer, 1 Shots of liquor per week  ? Drug use: Not Currently  ?  Types: Marijuana  ? Sexual activity: Yes  ? ? ? ?

## 2021-04-05 ENCOUNTER — Encounter (HOSPITAL_COMMUNITY): Payer: Self-pay | Admitting: Radiology

## 2021-05-17 ENCOUNTER — Ambulatory Visit (INDEPENDENT_AMBULATORY_CARE_PROVIDER_SITE_OTHER): Payer: Self-pay | Admitting: Orthopaedic Surgery

## 2021-05-17 ENCOUNTER — Encounter: Payer: Self-pay | Admitting: Orthopaedic Surgery

## 2021-05-17 ENCOUNTER — Ambulatory Visit (INDEPENDENT_AMBULATORY_CARE_PROVIDER_SITE_OTHER): Payer: Self-pay

## 2021-05-17 DIAGNOSIS — S72331A Displaced oblique fracture of shaft of right femur, initial encounter for closed fracture: Secondary | ICD-10-CM

## 2021-05-17 NOTE — Progress Notes (Signed)
? ?  Post-Op Visit Note ?  ?Patient: Eliazer Hemphill           ?Date of Birth: 31-Jul-1995           ?MRN: [id number] ?Visit Date: 05/17/2021 ?PCP: Pcp, No ? ? ?Assessment & Plan: ? ?Chief Complaint:  ?Chief Complaint  ?Patient presents with  ? Right Knee - Routine Post Op  ? ?Visit Diagnoses:  ?1. Displaced oblique fracture of shaft of right femur, initial encounter for closed fracture (HCC)   ? ? ?Plan: Patient is a pleasant 26 year old gentleman who comes in today 3 months status post right femur IM nail from a midshaft femur fracture, date of surgery 02/16/2021.  He has been doing well.  He is still in physical therapy making progress.  He has minimal pain.  Examination of his right lower extremity reveals full hip flexion.  Painless logroll.  Full knee extension and flexion.  He is neurovascularly intact distally.  At this point, his x-rays still demonstrate persistent fracture lucency to the lateral cortex consistent with delayed union.  I would like to go ahead and get approval for a bone stimulator.  He will follow-up with [place] in 6 weeks time for repeat evaluation and x-rays of the right femur.  Call with concerns or questions in the meantime. ? ?Follow-Up Instructions: Return if symptoms worsen or fail to improve.  ? ?Orders:  ?Orders Placed This Encounter  ?Procedures  ? XR FEMUR, MIN 2 VIEWS RIGHT  ? ?No orders of the defined types were placed in this encounter. ? ? ?Imaging: ?XR FEMUR, MIN 2 VIEWS RIGHT ? ?Result Date: 05/17/2021 ?X-rays demonstrate abundant callus formation, however there is still persistent fracture lucency to the lateral cortex consistent with delayed union  ? ?PMFS History: ?Patient Active Problem List  ? Diagnosis Date Noted  ? Liver laceration, closed, initial encounter 02/16/2021  ? Displaced oblique fracture of shaft of right femur, initial encounter for closed fracture (HCC) 02/16/2021  ? ?Past Medical History:  ?Diagnosis Date  ? Family history of adverse reaction to anesthesia   ?   ?History reviewed. No pertinent family history.  ?Past Surgical History:  ?Procedure Laterality Date  ? FEMUR IM NAIL Right 02/16/2021  ? Procedure: INTRAMEDULLARY (IM) NAIL FEMORAL;  Surgeon: Tarry Kos, MD;  Location: [facility] OR;  Service: Orthopedics;  Laterality: Right;  ? RECONSTRUCTION / REALIGNMENT PATELLA TENDON Right 2012  ? ?Social History  ? ?Occupational History  ? Not on file  ?Tobacco Use  ? Smoking status: Former  ?  Types: Cigarettes  ? Smokeless tobacco: Never  ?Vaping Use  ? Vaping Use: Never used  ?Substance and Sexual Activity  ? Alcohol use: Yes  ?  Alcohol/week: 2.0 standard drinks  ?  Types: 1 Cans of beer, 1 Shots of liquor per week  ? Drug use: Not Currently  ?  Types: Marijuana  ? Sexual activity: Yes  ? ? ? ?

## 2021-06-28 ENCOUNTER — Ambulatory Visit: Payer: Self-pay | Admitting: Orthopaedic Surgery

## 2021-07-23 ENCOUNTER — Ambulatory Visit (INDEPENDENT_AMBULATORY_CARE_PROVIDER_SITE_OTHER): Payer: Managed Care, Other (non HMO) | Admitting: Orthopaedic Surgery

## 2021-07-23 ENCOUNTER — Ambulatory Visit (INDEPENDENT_AMBULATORY_CARE_PROVIDER_SITE_OTHER): Payer: Managed Care, Other (non HMO)

## 2021-07-23 DIAGNOSIS — S72331A Displaced oblique fracture of shaft of right femur, initial encounter for closed fracture: Secondary | ICD-10-CM | POA: Diagnosis not present

## 2021-07-23 NOTE — Progress Notes (Signed)
   Post-Op Visit Note   Patient: Jared Potter           Date of Birth: 1995/06/11           MRN: [id number] Visit Date: 07/23/2021 PCP: Pcp, No   Assessment & Plan:  Chief Complaint:  Chief Complaint  Patient presents with   Right Hip - Follow-up    Femoral nailing 02/16/2021   Visit Diagnoses:  1. Displaced oblique fracture of shaft of right femur, initial encounter for closed fracture [facility])     Plan: Patient is a pleasant 26 year old gentleman who comes in today 5 months status post right femur retrograde nail 02/16/2021.  He has been doing great.  No pain.  He has not been in physical therapy lately.  Examination of the right femur reveals full hip and knee range of motion without pain.  He is neurovascular intact distally.  At this point, he is clinically and radiographically healed.  He will advance with activity as tolerated.  Follow-up as needed.  Call with concerns or questions.  Follow-Up Instructions: Return if symptoms worsen or fail to improve.   Orders:  Orders Placed This Encounter  Procedures   XR FEMUR, MIN 2 VIEWS RIGHT   No orders of the defined types were placed in this encounter.   Imaging: XR FEMUR, MIN 2 VIEWS RIGHT  Result Date: 07/23/2021 X-rays demonstrate a fully healed femur fracture   PMFS History: Patient Active Problem List   Diagnosis Date Noted   Liver laceration, closed, initial encounter 02/16/2021   Displaced oblique fracture of shaft of right femur, initial encounter for closed fracture (HCC) 02/16/2021   Past Medical History:  Diagnosis Date   Family history of adverse reaction to anesthesia     No family history on file.  Past Surgical History:  Procedure Laterality Date   FEMUR IM NAIL Right 02/16/2021   Procedure: INTRAMEDULLARY (IM) NAIL FEMORAL;  Surgeon: Tarry Kos, MD;  Location: [facility] OR;  Service: Orthopedics;  Laterality: Right;   RECONSTRUCTION / REALIGNMENT PATELLA TENDON Right 2012   Social History   Occupational  History   Not on file  Tobacco Use   Smoking status: Former    Types: Cigarettes   Smokeless tobacco: Never  Vaping Use   Vaping Use: Never used  Substance and Sexual Activity   Alcohol use: Yes    Alcohol/week: 2.0 standard drinks of alcohol    Types: 1 Cans of beer, 1 Shots of liquor per week   Drug use: Not Currently    Types: Marijuana   Sexual activity: Yes
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
## 2022-06-09 IMAGING — RF DG FEMUR 2+V*R*
1 series · 8 of 8 positions shown · non-contrast
Comparison: Radiograph earlier today.

CLINICAL DATA: Intramedullary femoral nail.

EXAM:
RIGHT FEMUR 2 VIEWS

[Series 1: run · 8 of 8 slices shown]
[im 1/8]
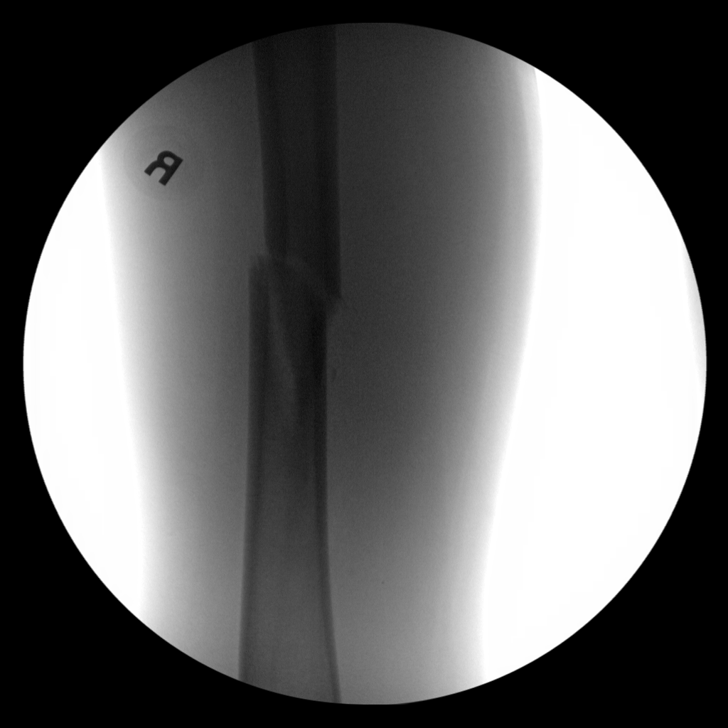
[im 2/8]
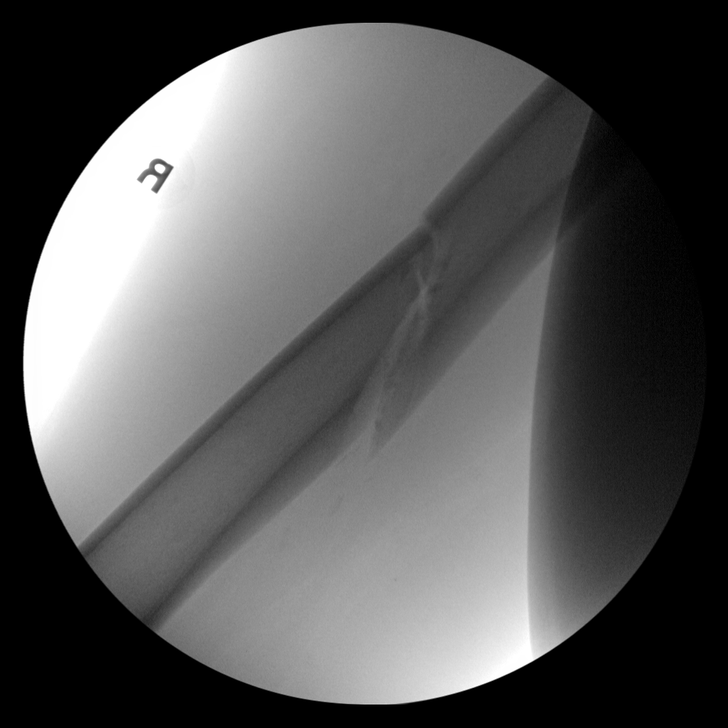
[im 3/8]
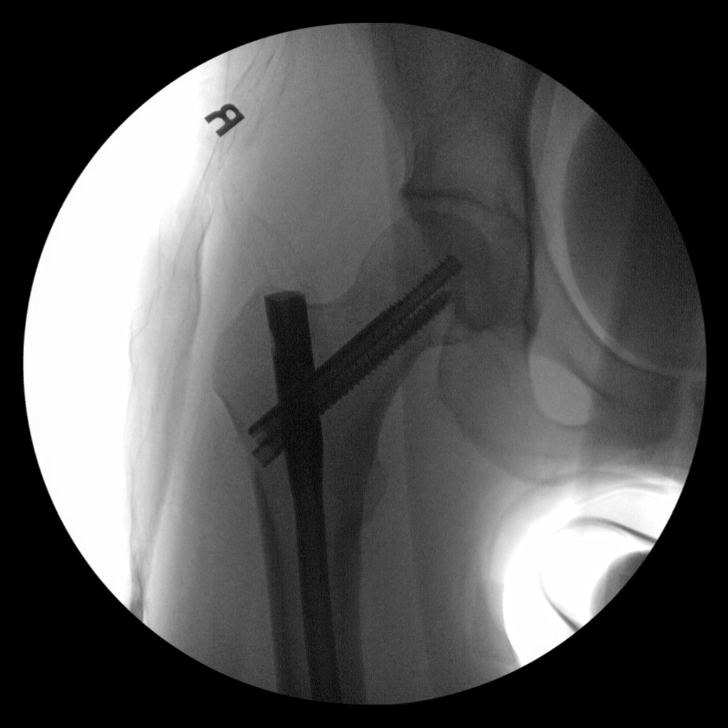
[im 4/8]
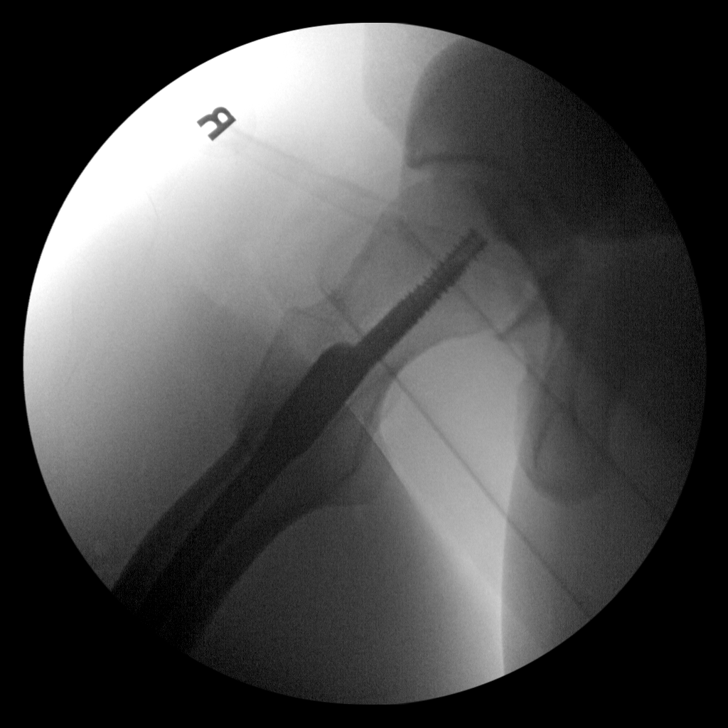
[im 5/8]
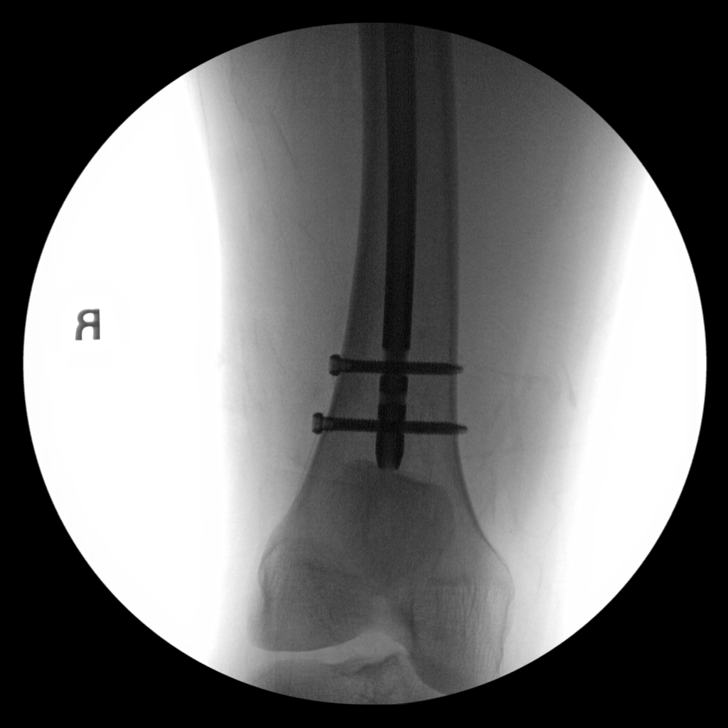
[im 6/8]
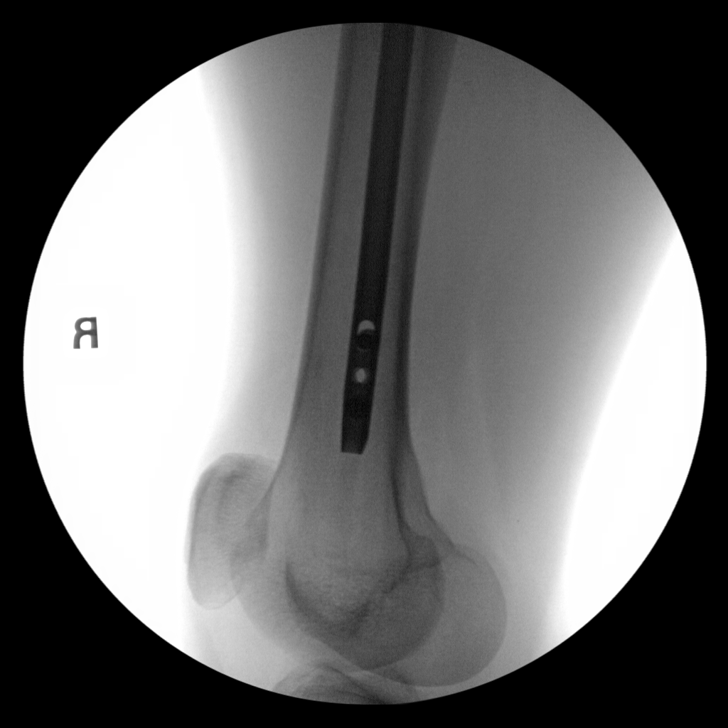
[im 7/8]
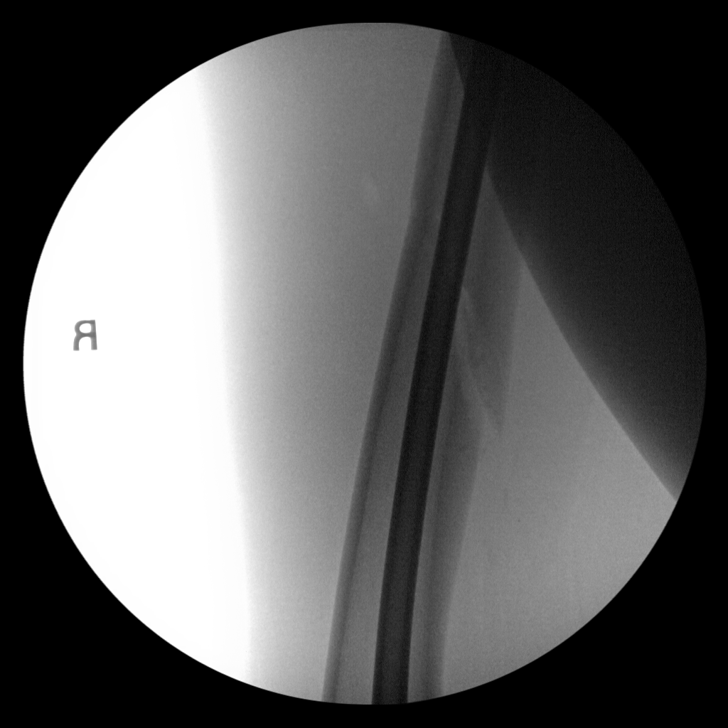
[im 8/8]
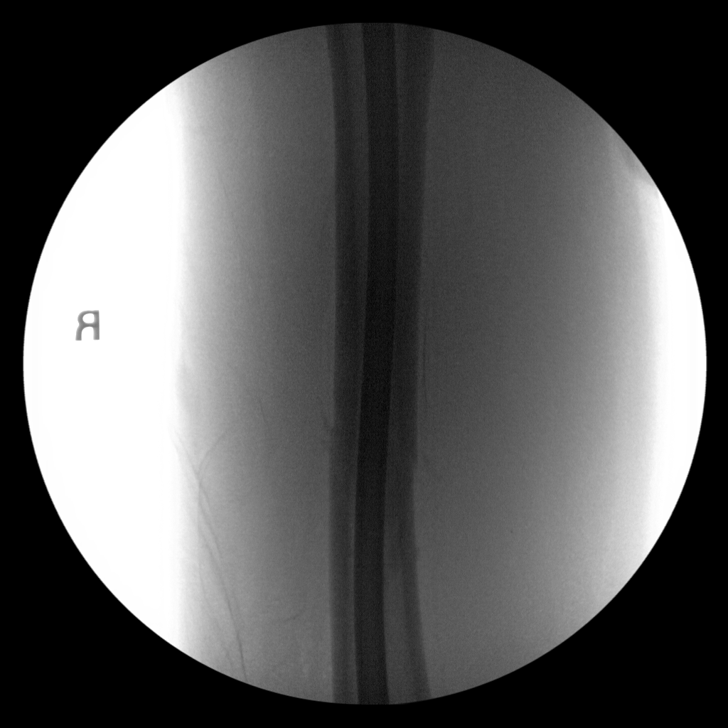

[8 of 8 positions shown; findings below may reference images not displayed]

FINDINGS: Eight fluoroscopic spot views of the right femur obtained in frontal
and lateral projections. Intramedullary nail with distal and trans
trochanteric locking screw fixation traverse midshaft femur
fracture. Improved fracture alignment. Fluoroscopy time 2 minutes 28
seconds. Dose 13.64 mGy.
IMPRESSION: Fluoroscopic spot views during intramedullary nail fixation of
midshaft femur fracture.

## 2022-06-09 IMAGING — DX DG FEMUR 2+V PORT*R*
4 series · 4 of 4 positions shown · non-contrast
Comparison: Right hip radiograph dated 02/16/2021.

CLINICAL DATA: ORIF of the right femur.

EXAM:
RIGHT FEMUR PORTABLE 2 VIEW

[femur ap proximal (1 of 2)]
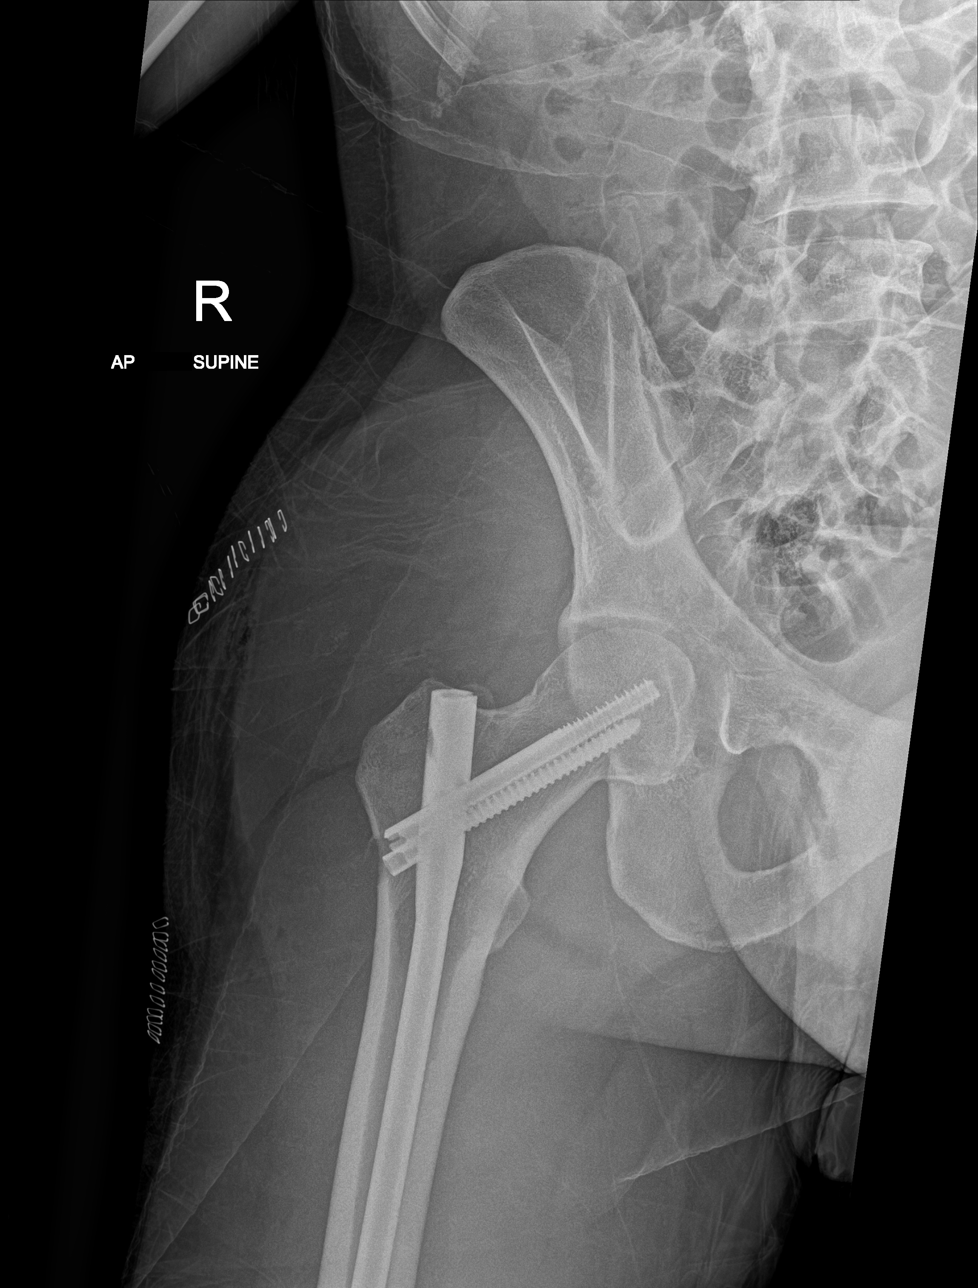

[femur ap proximal (2 of 2)]
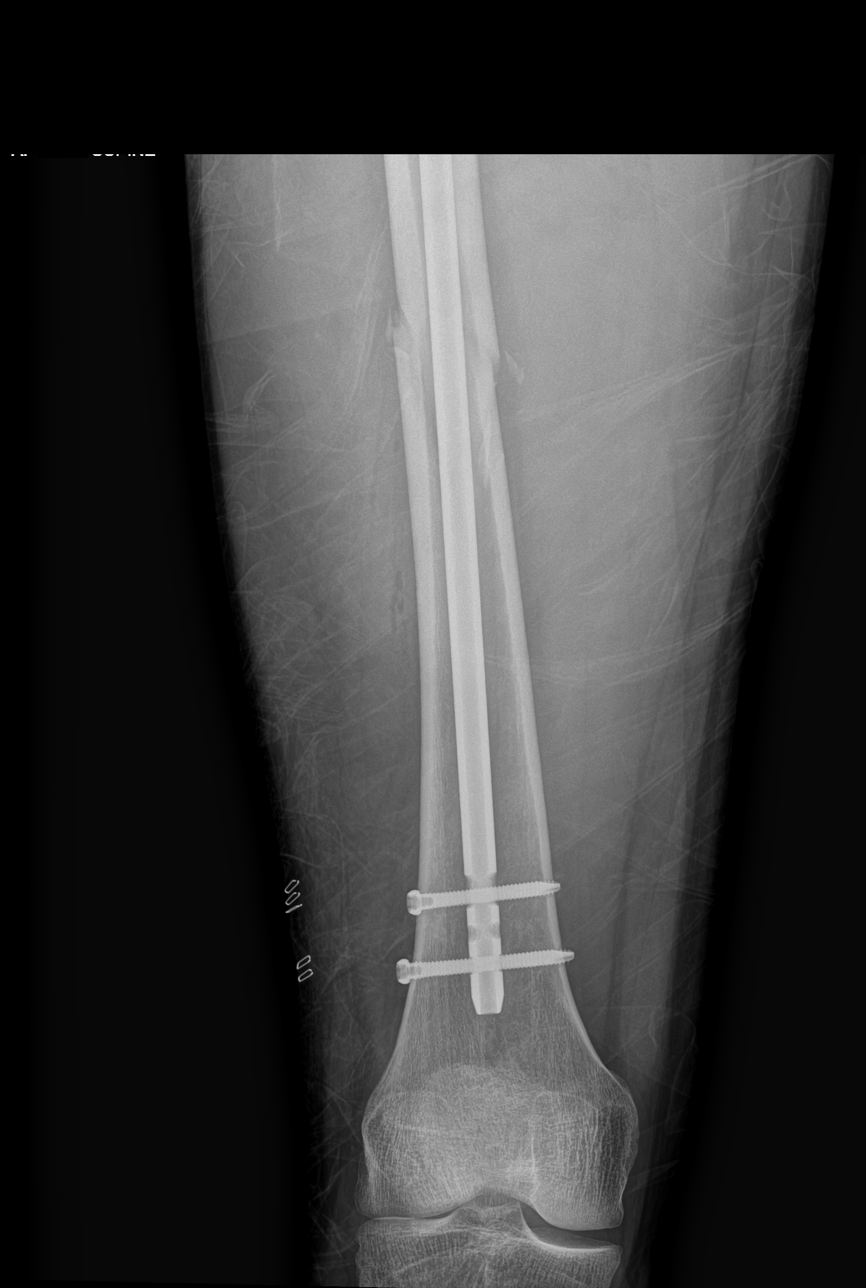

[femur lat (1 of 2)]
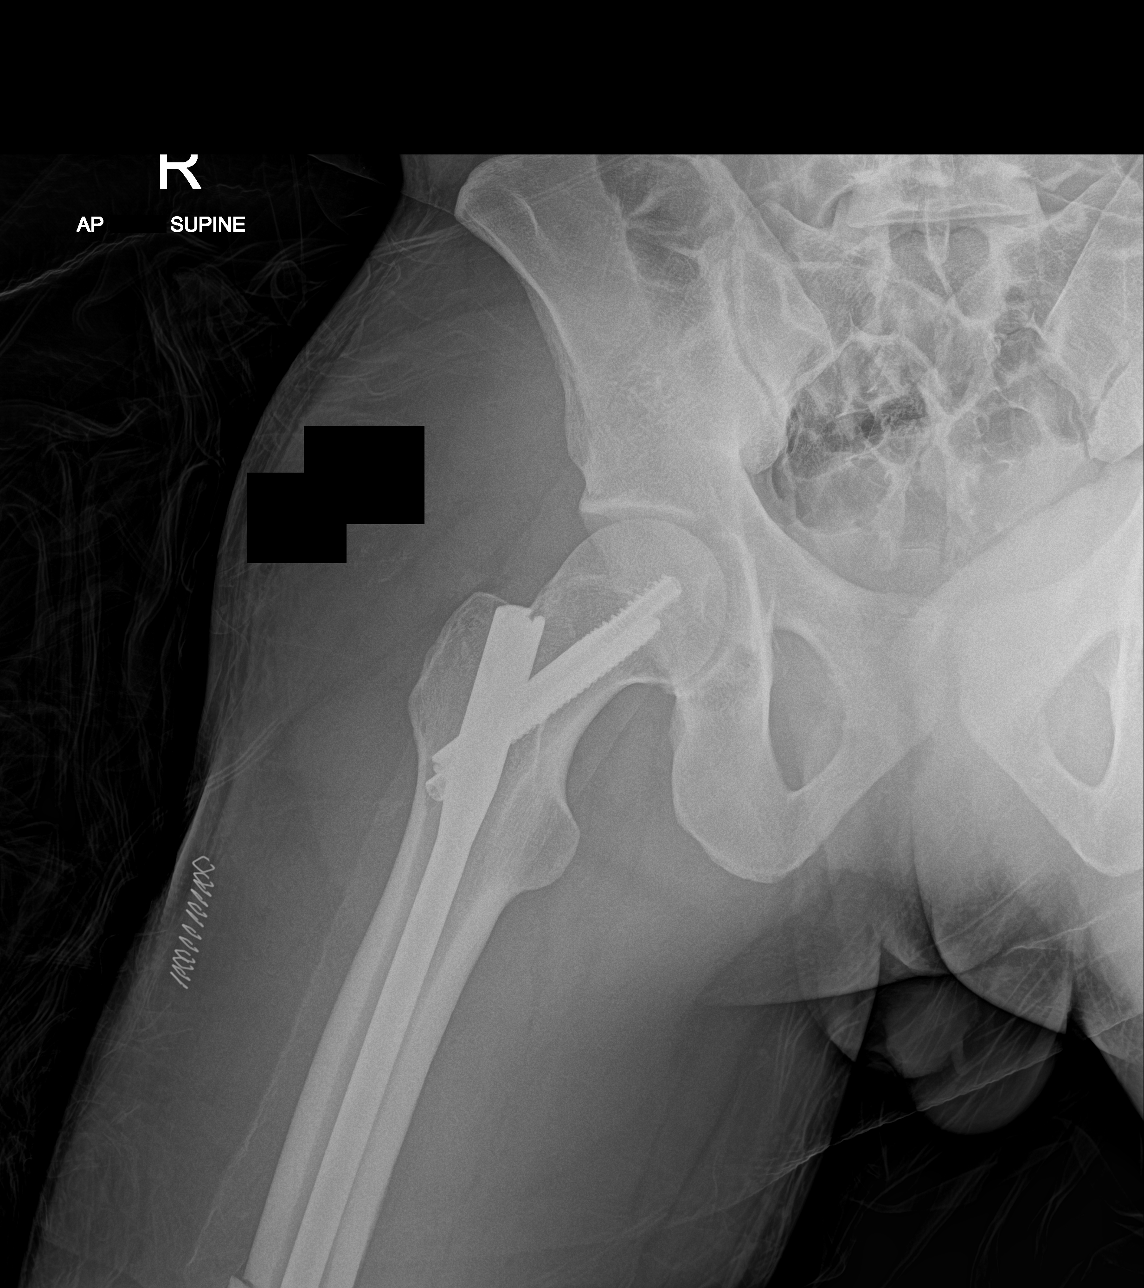

[femur lat (2 of 2)]
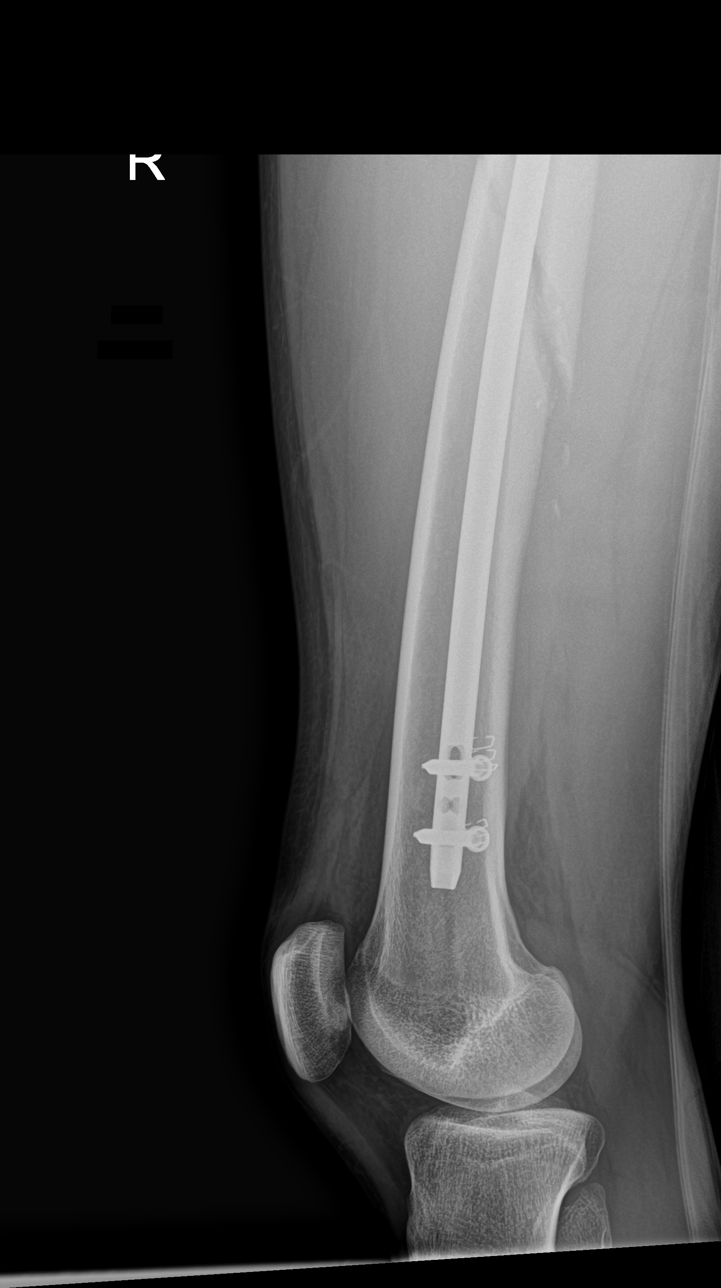

[4 of 4 positions shown; findings below may reference images not displayed]

FINDINGS: Status post ORIF of right femoral diaphysis fracture with
intramedullary nail and dynamic screws. The hardware is intact.
Cutaneous staples noted over the right hip.
IMPRESSION: Status post ORIF of right femur fracture.
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
